# Patient Record
Sex: Male | Born: 1975 | Race: Black or African American | Hispanic: No | Smoking: Current some day smoker
Health system: Southern US, Community
[De-identification: ages and names within clinical notes are randomized; demographics above are authoritative.]

## PROBLEM LIST (undated history)

## (undated) DIAGNOSIS — W3400XA Accidental discharge from unspecified firearms or gun, initial encounter: Secondary | ICD-10-CM

## (undated) DIAGNOSIS — I10 Essential (primary) hypertension: Secondary | ICD-10-CM

## (undated) HISTORY — DX: Accidental discharge from unspecified firearms or gun, initial encounter: W34.00XA

## (undated) HISTORY — PX: OTHER SURGICAL HISTORY: SHX169

## (undated) HISTORY — DX: Essential (primary) hypertension: I10

## (undated) HISTORY — PX: TONSILLECTOMY: SUR1361

---

## 2004-07-03 ENCOUNTER — Emergency Department: Payer: Self-pay | Admitting: Emergency Medicine

## 2006-05-02 ENCOUNTER — Emergency Department: Payer: Self-pay | Admitting: Emergency Medicine

## 2006-05-19 ENCOUNTER — Emergency Department: Payer: Self-pay | Admitting: Emergency Medicine

## 2008-03-21 ENCOUNTER — Emergency Department: Payer: Self-pay | Admitting: Emergency Medicine

## 2008-07-23 ENCOUNTER — Emergency Department: Payer: Self-pay | Admitting: Emergency Medicine

## 2010-09-09 ENCOUNTER — Inpatient Hospital Stay: Payer: Self-pay | Admitting: Internal Medicine

## 2010-09-10 DIAGNOSIS — I10 Essential (primary) hypertension: Secondary | ICD-10-CM

## 2010-09-11 ENCOUNTER — Telehealth: Payer: Self-pay | Admitting: *Deleted

## 2010-09-11 NOTE — Telephone Encounter (Signed)
Attempted to call pt to schedule treadmill myoview post discharge from [facility] per Dr. Shirlee Latch for elev c.e., jaw pain, htn. No answer x 1, unable to leave voicemail. Pt will need myoview early next week.

## 2010-09-14 ENCOUNTER — Other Ambulatory Visit: Payer: Self-pay | Admitting: Cardiology

## 2010-09-14 DIAGNOSIS — R6884 Jaw pain: Secondary | ICD-10-CM

## 2010-09-14 DIAGNOSIS — I10 Essential (primary) hypertension: Secondary | ICD-10-CM

## 2010-09-14 DIAGNOSIS — R748 Abnormal levels of other serum enzymes: Secondary | ICD-10-CM

## 2010-09-14 NOTE — Telephone Encounter (Signed)
Attempted to contact pt, LMOM TCB.  

## 2010-09-14 NOTE — Telephone Encounter (Signed)
Spoke to pt, scheduled myoview at [facility] for tomorrow 09/15/10, instructions given for test, pt verbalized understanding and has no questions. He will follow up in clinic with Dr. Mariah Milling, pt requested his appt be r/s for later date, he will come 09/29/10. Notified pt we will go over results at visit unless abnormal, will call.

## 2010-09-15 ENCOUNTER — Ambulatory Visit: Payer: Self-pay | Admitting: Cardiology

## 2010-09-15 DIAGNOSIS — I10 Essential (primary) hypertension: Secondary | ICD-10-CM

## 2010-09-16 ENCOUNTER — Telehealth: Payer: Self-pay | Admitting: *Deleted

## 2010-09-16 NOTE — Telephone Encounter (Signed)
Called and notified pt that per Dr. Mariah Milling his stress test at [facility] was normal. Pt will f/u 09/29/10 in office.

## 2010-09-22 ENCOUNTER — Encounter: Payer: Self-pay | Admitting: Cardiovascular Disease

## 2010-09-29 ENCOUNTER — Encounter: Payer: Self-pay | Admitting: Cardiovascular Disease

## 2010-09-29 ENCOUNTER — Ambulatory Visit (INDEPENDENT_AMBULATORY_CARE_PROVIDER_SITE_OTHER): Payer: Self-pay | Admitting: Cardiovascular Disease

## 2010-09-29 DIAGNOSIS — I10 Essential (primary) hypertension: Secondary | ICD-10-CM

## 2010-09-29 DIAGNOSIS — R079 Chest pain, unspecified: Secondary | ICD-10-CM

## 2010-09-29 MED ORDER — VERAPAMIL HCL ER 120 MG PO TBCR: 120.0000 mg | EXTENDED_RELEASE_TABLET | Freq: Two times a day (BID) | ORAL | Status: DC

## 2010-09-29 NOTE — Assessment & Plan Note (Signed)
Blood pressure continues to be elevated. We will stop his carvedilol. Will start verapamil 120 mg b.i.d., continue his lisinopril 20 mg daily. We've asked him to monitor his blood pressures an outpatient and we will see him at close followup.

## 2010-09-29 NOTE — Progress Notes (Signed)
   Patient ID: Jeffery [name], male    DOB: May 17, 1975, 35 y.o.   MRN: [id number]  HPI Comments: 35 year old African American gentleman who presented to the hospital 2 weeks ago with symptoms of earache radiating to his jaw, severe hypertension with systolic pressure 180/100, elevated cardiac enzymes. Echocardiogram showed ejection fraction was essentially normal. No significant LVH. Overall it was a normal study. He was started on Coreg and lisinopril he now returns for followup. He does not have a blood pressure cuff at home. He has been feeling well on the current medications.  He denies any significant symptoms. No further earache or jaw pain. No chest pain no shortness of breath.  EKG shows normal sinus rhythm with rate 61 beats per minute with no significant ST-T wave changes  Outpatient Encounter Prescriptions as of 09/29/2010  Medication Sig Dispense Refill  . aspirin 81 MG tablet Take 81 mg by mouth daily.        . carvedilol (COREG) 12.5 MG tablet Take 12.5 mg by mouth 2 (two) times daily with a meal.        . lisinopril (PRINIVIL,ZESTRIL) 20 MG tablet Take 20 mg by mouth daily.            Review of Systems  Constitutional: Negative.   HENT: Negative.   Eyes: Negative.   Respiratory: Negative.   Cardiovascular: Negative.   Gastrointestinal: Negative.   Musculoskeletal: Negative.   Skin: Negative.   Neurological: Negative.   Hematological: Negative.   Psychiatric/Behavioral: Negative.   All other systems reviewed and are negative.   BP 158/100  Pulse 64  Ht 5\' 11"  (1.803 m)  Wt 185 lb (83.915 kg)  BMI 25.80 kg/m2   Physical Exam  Nursing note and vitals reviewed. Constitutional: He is oriented to person, place, and time. He appears well-developed and well-nourished.  HENT:  Head: Normocephalic.  Nose: Nose normal.  Mouth/Throat: Oropharynx is clear and moist.  Eyes: Conjunctivae are normal. Pupils are equal, round, and reactive to light.  Neck: Normal range of  motion. Neck supple. No JVD present.  Cardiovascular: Normal rate, regular rhythm, S1 normal, S2 normal, normal heart sounds and intact distal pulses.  Exam reveals no gallop and no friction rub.   No murmur heard. Pulmonary/Chest: Effort normal and breath sounds normal. No respiratory distress. He has no wheezes. He has no rales. He exhibits no tenderness.  Abdominal: Soft. Bowel sounds are normal. He exhibits no distension. There is no tenderness.  Musculoskeletal: Normal range of motion. He exhibits no edema and no tenderness.  Lymphadenopathy:    He has no cervical adenopathy.  Neurological: He is alert and oriented to person, place, and time. Coordination normal.  Skin: Skin is warm and dry. No rash noted. No erythema.  Psychiatric: He has a normal mood and affect. His behavior is normal. Judgment and thought content normal.           Assessment and Plan

## 2010-09-29 NOTE — Patient Instructions (Signed)
You are doing well. Please hold the coreg. Start verapamil 120 mg twice a day (AM and PM) Check your blood pressure in the next few weeks Please call us if you have new issues that need to be addressed before your next appt.  We will call you for a follow up Appt. In 6 months

## 2010-10-02 ENCOUNTER — Other Ambulatory Visit: Payer: Self-pay | Admitting: *Deleted

## 2010-10-02 MED ORDER — DILTIAZEM HCL ER 90 MG PO CP12: 90.0000 mg | ORAL_CAPSULE | Freq: Two times a day (BID) | ORAL | Status: DC

## 2010-10-02 NOTE — Telephone Encounter (Signed)
Notified pt that Dr. Mariah Milling will change his rx from Verapamil to Diltiazem 90 bid. This should be cheaper medication, it is on $4 [employer]. Advised he try this and let us know how it is working for you.

## 2010-10-05 ENCOUNTER — Other Ambulatory Visit: Payer: Self-pay | Admitting: *Deleted

## 2010-10-05 MED ORDER — DILTIAZEM HCL 90 MG PO TABS: 90.0000 mg | ORAL_TABLET | Freq: Two times a day (BID) | ORAL | Status: DC

## 2010-10-15 ENCOUNTER — Other Ambulatory Visit: Payer: Self-pay | Admitting: Cardiovascular Disease

## 2010-10-15 NOTE — Telephone Encounter (Signed)
Patient calling with elevated blood pressure.  The blood pressure today is reading at 9:00 am today 169/99, 186/106 heart rate 74, and one hour later 156/94.  He does c/o some headache but has eased off now.  He has been out of the lisinopril since Monday, October 12, 2010.  I sent in a refill today for the lisinopril.  The patient will take the medication as soon as he picks up at the pharmacy and will monitor blood pressure after he takes it and was told to call the office with readings and was instructed if blood pressure still elevated with symptoms to call office or have EMS come to evaluate the blood pressure.

## 2010-10-18 ENCOUNTER — Emergency Department: Payer: Self-pay | Admitting: Internal Medicine

## 2013-04-03 ENCOUNTER — Encounter: Payer: Self-pay | Admitting: *Deleted

## 2015-06-26 ENCOUNTER — Encounter: Payer: Self-pay | Admitting: Podiatry

## 2015-06-26 ENCOUNTER — Ambulatory Visit: Payer: Self-pay

## 2015-06-26 NOTE — Progress Notes (Signed)
No show

## 2017-06-13 ENCOUNTER — Encounter: Payer: Self-pay | Admitting: *Deleted

## 2017-06-13 ENCOUNTER — Ambulatory Visit
Admission: EM | Admit: 2017-06-13 | Discharge: 2017-06-13 | Disposition: A | Discharge status: Home / Self Care | Payer: BLUE CROSS/BLUE SHIELD | Attending: Family Medicine | Admitting: Family Medicine

## 2017-06-13 DIAGNOSIS — J069 Acute upper respiratory infection, unspecified: Secondary | ICD-10-CM | POA: Diagnosis not present

## 2017-06-13 DIAGNOSIS — B9789 Other viral agents as the cause of diseases classified elsewhere: Secondary | ICD-10-CM | POA: Diagnosis not present

## 2017-06-13 LAB — RAPID STREP SCREEN (MED CTR MEBANE ONLY): STREPTOCOCCUS, GROUP A SCREEN (DIRECT): NEGATIVE

## 2017-06-13 MED ORDER — FLUTICASONE PROPIONATE 50 MCG/ACT NA SUSP: 2.0000 | Freq: Every day | NASAL | 0 refills | Status: DC

## 2017-06-13 MED ORDER — CETIRIZINE HCL 10 MG PO TABS: 10.0000 mg | ORAL_TABLET | Freq: Every day | ORAL | 0 refills | Status: DC

## 2017-06-13 NOTE — ED Triage Notes (Signed)
Runny nose, head congestion, sore throat, fever, onset last night. Daughter dx with URI 1 week ago.  Also, injury to right middle finger 1 month ago. Nail is discolored.
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
## 2017-06-16 LAB — CULTURE, GROUP A STREP (THRC)

## 2017-07-05 ENCOUNTER — Other Ambulatory Visit: Payer: Self-pay | Admitting: Family Medicine

## 2018-04-08 ENCOUNTER — Encounter: Payer: Self-pay | Admitting: Emergency Medicine

## 2018-04-08 ENCOUNTER — Other Ambulatory Visit: Payer: Self-pay

## 2018-04-08 ENCOUNTER — Ambulatory Visit
Admission: EM | Admit: 2018-04-08 | Discharge: 2018-04-08 | Disposition: A | Discharge status: Home / Self Care | Payer: BLUE CROSS/BLUE SHIELD | Attending: Family Medicine | Admitting: Family Medicine

## 2018-04-08 DIAGNOSIS — B353 Tinea pedis: Secondary | ICD-10-CM | POA: Diagnosis not present

## 2018-04-08 DIAGNOSIS — L0889 Other specified local infections of the skin and subcutaneous tissue: Secondary | ICD-10-CM

## 2018-04-08 MED ORDER — FLUCONAZOLE 150 MG PO TABS: ORAL_TABLET | ORAL | 0 refills | Status: DC

## 2018-04-08 MED ORDER — SULFAMETHOXAZOLE-TRIMETHOPRIM 800-160 MG PO TABS: 1.0000 | ORAL_TABLET | Freq: Two times a day (BID) | ORAL | 0 refills | Status: DC

## 2018-04-08 NOTE — ED Provider Notes (Signed)
[facility]    CSN: [id number] Arrival date & time: 04/08/18  1540     History   Chief Complaint Chief Complaint  Patient presents with  . Blister    APPOINTMENT    HPI Janell Cocchi is a 43 y.o. male.   43 yo male with a c/o "foot problems" for one year. States he's had athlete's foot that doesn't go away and a recurring blister on one of his toes. Denies any fevers, chills.   The history is provided by the patient.    Past Medical History:  Diagnosis Date  . Hypertension     There are no active problems to display for this patient.   Past Surgical History:  Procedure Laterality Date  . TONSILLECTOMY         Home Medications    Prior to Admission medications   Medication Sig Start Date End Date Taking? Authorizing Provider  lisinopril-hydrochlorothiazide (PRINZIDE,ZESTORETIC) 20-25 MG tablet Take 1 tablet by mouth daily.   Yes [provider]  fluconazole (DIFLUCAN) 150 MG tablet Take one tablet today, then repeat in 1 week 04/08/18   Payton Mccallum, MD  sulfamethoxazole-trimethoprim (BACTRIM DS,SEPTRA DS) 800-160 MG tablet Take 1 tablet by mouth 2 (two) times daily. 04/08/18   Payton Mccallum, MD    Family History Family History  Problem Relation Age of Onset  . Cancer Mother     Social History Social History   Tobacco Use  . Smoking status: Former Smoker    Types: Cigars  . Smokeless tobacco: Never Used  Substance Use Topics  . Alcohol use: Yes  . Drug use: Never     Allergies   Patient has no known allergies.   Review of Systems Review of Systems   Physical Exam Triage Vital Signs ED Triage Vitals  Enc Vitals Group     BP 04/08/18 1007 (!) 144/82     Pulse Rate 04/08/18 1007 99     Resp 04/08/18 1007 16     Temp 04/08/18 1007 98.1 F (36.7 C)     Temp Source 04/08/18 1007 Oral     SpO2 04/08/18 1007 100 %     Weight 04/08/18 1003 225 lb (102.1 kg)     Height 04/08/18 1003 5\' 11"  (1.803 m)     Head  Circumference --      Peak Flow --      Pain Score 04/08/18 1003 0     Pain Loc --      Pain Edu? --      Excl. in GC? --    No data found.  Updated Vital Signs BP (!) 144/82 (BP Location: Left Arm)   Pulse 99   Temp 98.1 F (36.7 C) (Oral)   Resp 16   Ht 5\' 11"  (1.803 m)   Wt 102.1 kg   SpO2 100%   BMI 31.38 kg/m   Visual Acuity Right Eye Distance:   Left Eye Distance:   Bilateral Distance:    Right Eye Near:   Left Eye Near:    Bilateral Near:     Physical Exam Vitals signs and nursing note reviewed.  Constitutional:      Appearance: He is not toxic-appearing.  Skin:    Comments: Scaly, cracked skin between toes on left foot with 3rd toe dorsal superficial blister with mild purulent drainage  Neurological:     Mental Status: He is alert.      UC Treatments / Results  Labs (all labs ordered  are listed, but only abnormal results are displayed) Labs Reviewed - No data to display  EKG None  Radiology No results found.  Procedures Procedures (including critical care time)  Medications Ordered in UC Medications - No data to display  Initial Impression / Assessment and Plan / UC Course  I have reviewed the triage vital signs and the nursing notes.  Pertinent labs & imaging results that were available during my care of the patient were reviewed by me and considered in my medical decision making (see chart for details).      Final Clinical Impressions(s) / UC Diagnoses   Final diagnoses:  Tinea pedis of left foot  Secondary infection of skin      Discharge Instructions     Over the counter Tinactin or Lamisil antifungal powder apply two-three times per day between toes and in socks and shoes    ED Prescriptions    Medication Sig Dispense Auth. Provider   fluconazole (DIFLUCAN) 150 MG tablet Take one tablet today, then repeat in 1 week 2 tablet Emilio Baylock, MD   sulfamethoxazole-trimethoprim (BACTRIM DS,SEPTRA DS) 800-160 MG tablet Take  1 tablet by mouth 2 (two) times daily. 20 tablet Payton Mccallum, MD     1. diagnosis reviewed with patient 2. rx as per orders above; reviewed possible side effects, interactions, risks and benefits  3. Recommend supportive treatment as above  4. Follow-up prn if symptoms worsen or don't improve   Controlled Substance Prescriptions [place] Controlled Substance Registry consulted? Not Applicable   Payton Mccallum, MD 04/08/18 1056

## 2018-04-08 NOTE — ED Triage Notes (Signed)
Patient c/o blister on his left 4th toe that has been there for a year.

## 2018-04-08 NOTE — Discharge Instructions (Addendum)
Over the counter Tinactin or Lamisil antifungal powder apply two-three times per day between toes and in socks and shoes

## 2018-04-10 ENCOUNTER — Encounter: Payer: Self-pay | Admitting: *Deleted

## 2018-05-18 ENCOUNTER — Ambulatory Visit (INDEPENDENT_AMBULATORY_CARE_PROVIDER_SITE_OTHER): Payer: BLUE CROSS/BLUE SHIELD

## 2018-05-18 ENCOUNTER — Other Ambulatory Visit: Payer: Self-pay

## 2018-05-18 ENCOUNTER — Encounter: Payer: Self-pay | Admitting: Emergency Medicine

## 2018-05-18 ENCOUNTER — Ambulatory Visit
Admission: EM | Admit: 2018-05-18 | Discharge: 2018-05-18 | Disposition: A | Discharge status: Home / Self Care | Payer: BLUE CROSS/BLUE SHIELD | Attending: Family Medicine | Admitting: Family Medicine

## 2018-05-18 DIAGNOSIS — L84 Corns and callosities: Secondary | ICD-10-CM | POA: Diagnosis not present

## 2018-05-18 DIAGNOSIS — B353 Tinea pedis: Secondary | ICD-10-CM

## 2018-05-18 DIAGNOSIS — I10 Essential (primary) hypertension: Secondary | ICD-10-CM

## 2018-05-18 MED ORDER — KETOCONAZOLE 2 % EX CREA: 1.0000 "application " | TOPICAL_CREAM | Freq: Every day | CUTANEOUS | 0 refills | Status: DC
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
## 2018-05-18 NOTE — ED Triage Notes (Signed)
Patient c/o blister on his toe on his right foot that started 1.5 months ago.

## 2019-07-18 ENCOUNTER — Ambulatory Visit: Payer: Self-pay | Attending: Internal Medicine

## 2019-07-18 DIAGNOSIS — Z23 Encounter for immunization: Secondary | ICD-10-CM

## 2019-07-18 NOTE — Progress Notes (Signed)
   Covid-19 Vaccination Clinic  Name:  Jeffery [name]    MRN: [id number] DOB: 03/22/1976  07/18/2019  [name] was observed post Covid-19 immunization for 15 minutes without incident. He was provided with Vaccine Information Sheet and instruction to access the V-Safe system.   [name] was instructed to call 911 with any severe reactions post vaccine: Marland Kitchen Difficulty breathing  . Swelling of face and throat  . A fast heartbeat  . A bad rash all over body  . Dizziness and weakness   Immunizations Administered    Name Date Dose VIS Date Route   Pfizer COVID-19 Vaccine 07/18/2019  9:47 AM 0.3 mL 05/16/2018 Intramuscular   Manufacturer: [employer], [employer]   Lot: [id number]   NDC: [address]-6

## 2019-08-14 ENCOUNTER — Ambulatory Visit: Payer: Self-pay

## 2019-08-14 ENCOUNTER — Ambulatory Visit: Payer: Self-pay | Attending: Internal Medicine

## 2019-08-14 DIAGNOSIS — Z23 Encounter for immunization: Secondary | ICD-10-CM

## 2019-08-14 NOTE — Progress Notes (Addendum)
   Covid-19 Vaccination Clinic  Name:  OMAURI BOEVE    MRN: [id number] DOB: September 07, 1975  08/14/2019  Mr. Humiston was observed post Covid-19 immunization for 15 minutes without incident. He was provided with Vaccine Information Sheet and instruction to access the V-Safe system.   Mr. Kydd was instructed to call 911 with any severe reactions post vaccine: Marland Kitchen Difficulty breathing  . Swelling of face and throat  . A fast heartbeat  . A bad rash all over body  . Dizziness and weakness   Immunizations Administered    Name Date Dose VIS Date Route   Pfizer COVID-19 Vaccine 08/14/2019  9:51 AM 0.3 mL 05/16/2018 Intramuscular   Manufacturer: [employer], [employer]   Lot: [id number]   NDC: [address]-1

## 2019-09-09 ENCOUNTER — Other Ambulatory Visit: Payer: Self-pay

## 2019-09-09 ENCOUNTER — Observation Stay
Admission: EM | Admit: 2019-09-09 | Discharge: 2019-09-10 | Disposition: A | Discharge status: Home / Self Care | Payer: Self-pay | Attending: Internal Medicine | Admitting: Internal Medicine

## 2019-09-09 ENCOUNTER — Emergency Department: Payer: Self-pay

## 2019-09-09 DIAGNOSIS — Z79899 Other long term (current) drug therapy: Secondary | ICD-10-CM | POA: Insufficient documentation

## 2019-09-09 DIAGNOSIS — Z20822 Contact with and (suspected) exposure to covid-19: Secondary | ICD-10-CM | POA: Insufficient documentation

## 2019-09-09 DIAGNOSIS — I10 Essential (primary) hypertension: Secondary | ICD-10-CM | POA: Insufficient documentation

## 2019-09-09 DIAGNOSIS — I200 Unstable angina: Principal | ICD-10-CM | POA: Insufficient documentation

## 2019-09-09 DIAGNOSIS — Z87891 Personal history of nicotine dependence: Secondary | ICD-10-CM | POA: Insufficient documentation

## 2019-09-09 DIAGNOSIS — R079 Chest pain, unspecified: Secondary | ICD-10-CM | POA: Diagnosis present

## 2019-09-09 DIAGNOSIS — Z7982 Long term (current) use of aspirin: Secondary | ICD-10-CM | POA: Insufficient documentation

## 2019-09-09 DIAGNOSIS — Z8249 Family history of ischemic heart disease and other diseases of the circulatory system: Secondary | ICD-10-CM | POA: Insufficient documentation

## 2019-09-09 DIAGNOSIS — R011 Cardiac murmur, unspecified: Secondary | ICD-10-CM | POA: Insufficient documentation

## 2019-09-09 LAB — COMPREHENSIVE METABOLIC PANEL
ALT: 27 U/L (ref 0–44)
AST: 21 U/L (ref 15–41)
Albumin: 4.5 g/dL (ref 3.5–5.0)
Alkaline Phosphatase: 73 U/L (ref 38–126)
Anion gap: 13 (ref 5–15)
BUN: 22 mg/dL — ABNORMAL HIGH (ref 6–20)
CO2: 24 mmol/L (ref 22–32)
Calcium: 10.1 mg/dL (ref 8.9–10.3)
Chloride: 101 mmol/L (ref 98–111)
Creatinine, Ser: 1.24 mg/dL (ref 0.61–1.24)
GFR calc Af Amer: >60 mL/min (ref >60)
GFR calc non Af Amer: >60 mL/min (ref >60)
Glucose, Bld: 100 mg/dL — ABNORMAL HIGH (ref 70–99)
Potassium: 3.6 mmol/L (ref 3.5–5.1)
Sodium: 138 mmol/L (ref 135–145)
Total Bilirubin: 0.9 mg/dL (ref 0.3–1.2)
Total Protein: 7.8 g/dL (ref 6.5–8.1)

## 2019-09-09 LAB — CBC
HCT: 43.5 % (ref 39.0–52.0)
Hemoglobin: 15.6 g/dL (ref 13.0–17.0)
MCH: 31.8 pg (ref 26.0–34.0)
MCHC: 35.9 g/dL (ref 30.0–36.0)
MCV: 88.8 fL (ref 80.0–100.0)
Platelets: 296 10*3/uL (ref 150–400)
RBC: 4.9 MIL/uL (ref 4.22–5.81)
RDW: 12 % (ref 11.5–15.5)
WBC: 16.3 10*3/uL — ABNORMAL HIGH (ref 4.0–10.5)
nRBC: 0 % (ref 0.0–0.2)

## 2019-09-09 LAB — TROPONIN I (HIGH SENSITIVITY): Troponin I (High Sensitivity): 4 ng/L (ref <18)

## 2019-09-09 NOTE — ED Notes (Signed)
Pt c/o pressure in lower back, denies CP, N/V/D, SOB, dizziness. Pt AOX4, NAD noted. Skin warm, dry. Pt reports he was smoking and drinking a  "couple of shots" at friends house for father's day today.

## 2019-09-09 NOTE — ED Notes (Signed)
Second ekg given to dr. Penne Lash who states is going to call cardiology now, will not activate code stemi immediately per isaacs, but will need treatment room. Charge rn notified.

## 2019-09-09 NOTE — ED Triage Notes (Signed)
Pt states mid chest pain and upper back pain that radiates at times to shoulders off and on for last couple of weeks. Pt states intermittent "not feeling like myself". Pt denies nausea, diaphoresis, but states does have some intermittent shob with pain.

## 2019-09-10 ENCOUNTER — Encounter: Payer: Self-pay | Admitting: Family Medicine

## 2019-09-10 ENCOUNTER — Other Ambulatory Visit: Payer: Self-pay

## 2019-09-10 ENCOUNTER — Observation Stay (HOSPITAL_BASED_OUTPATIENT_CLINIC_OR_DEPARTMENT_OTHER): Payer: Self-pay

## 2019-09-10 DIAGNOSIS — E785 Hyperlipidemia, unspecified: Secondary | ICD-10-CM | POA: Insufficient documentation

## 2019-09-10 DIAGNOSIS — I10 Essential (primary) hypertension: Secondary | ICD-10-CM

## 2019-09-10 DIAGNOSIS — F1210 Cannabis abuse, uncomplicated: Secondary | ICD-10-CM

## 2019-09-10 DIAGNOSIS — R079 Chest pain, unspecified: Secondary | ICD-10-CM | POA: Diagnosis present

## 2019-09-10 DIAGNOSIS — E669 Obesity, unspecified: Secondary | ICD-10-CM | POA: Insufficient documentation

## 2019-09-10 LAB — NM MYOCAR MULTI W/SPECT W/WALL MOTION / EF
LV dias vol: 132 mL (ref 62–150)
LV sys vol: 60 mL
Peak HR: 118 {beats}/min
Percent HR: 66 %
Rest HR: 82 {beats}/min
SDS: 2
SRS: 5
SSS: 4
TID: 1.04

## 2019-09-10 LAB — URINE DRUG SCREEN, QUALITATIVE (ARMC ONLY)
Amphetamines, Ur Screen: NOT DETECTED
Barbiturates, Ur Screen: NOT DETECTED
Benzodiazepine, Ur Scrn: NOT DETECTED
Cannabinoid 50 Ng, Ur ~~LOC~~: POSITIVE — AB
Cocaine Metabolite,Ur ~~LOC~~: NOT DETECTED
MDMA (Ecstasy)Ur Screen: NOT DETECTED
Methadone Scn, Ur: NOT DETECTED
Opiate, Ur Screen: NOT DETECTED
Phencyclidine (PCP) Ur S: NOT DETECTED
Tricyclic, Ur Screen: NOT DETECTED

## 2019-09-10 LAB — URINALYSIS, COMPLETE (UACMP) WITH MICROSCOPIC
Bacteria, UA: NONE SEEN
Bilirubin Urine: NEGATIVE
Glucose, UA: NEGATIVE mg/dL
Hgb urine dipstick: NEGATIVE
Ketones, ur: NEGATIVE mg/dL
Leukocytes,Ua: NEGATIVE
Nitrite: NEGATIVE
Protein, ur: NEGATIVE mg/dL
Specific Gravity, Urine: 1.025 (ref 1.005–1.030)
Squamous Epithelial / HPF: NONE SEEN (ref 0–5)
pH: 5 (ref 5.0–8.0)

## 2019-09-10 LAB — TROPONIN I (HIGH SENSITIVITY): Troponin I (High Sensitivity): 4 ng/L (ref <18)

## 2019-09-10 LAB — LIPID PANEL
Cholesterol: 228 mg/dL — ABNORMAL HIGH (ref 0–200)
HDL: 48 mg/dL (ref >40)
LDL Cholesterol: 149 mg/dL — ABNORMAL HIGH (ref 0–99)
Total CHOL/HDL Ratio: 4.8 RATIO
Triglycerides: 155 mg/dL — ABNORMAL HIGH (ref <150)
VLDL: 31 mg/dL (ref 0–40)

## 2019-09-10 LAB — HIV ANTIBODY (ROUTINE TESTING W REFLEX): HIV Screen 4th Generation wRfx: NONREACTIVE

## 2019-09-10 LAB — SARS CORONAVIRUS 2 BY RT PCR (HOSPITAL ORDER, PERFORMED IN ~~LOC~~ HOSPITAL LAB): SARS Coronavirus 2: NEGATIVE

## 2019-09-10 MED ORDER — HYDROCHLOROTHIAZIDE 25 MG PO TABS: 25.0000 mg | ORAL_TABLET | Freq: Every day | ORAL | Status: DC

## 2019-09-10 MED ORDER — ATORVASTATIN CALCIUM 20 MG PO TABS: 40.0000 mg | ORAL_TABLET | Freq: Every day | ORAL | Status: DC

## 2019-09-10 MED ORDER — ASPIRIN 81 MG PO CHEW
324.0000 mg | CHEWABLE_TABLET | Freq: Once | ORAL | Status: AC
  Administered 2019-09-10: 324 mg via ORAL
  Filled 2019-09-10: qty 4

## 2019-09-10 MED ORDER — ASPIRIN EC 81 MG PO TBEC
81.0000 mg | DELAYED_RELEASE_TABLET | Freq: Every day | ORAL | Status: DC
  Administered 2019-09-10: 81 mg via ORAL
  Filled 2019-09-10: qty 1

## 2019-09-10 MED ORDER — NITROGLYCERIN 0.4 MG SL SUBL: 0.4000 mg | SUBLINGUAL_TABLET | SUBLINGUAL | Status: DC | PRN

## 2019-09-10 MED ORDER — MORPHINE SULFATE (PF) 2 MG/ML IV SOLN: 2.0000 mg | INTRAVENOUS | Status: DC | PRN

## 2019-09-10 MED ORDER — LISINOPRIL 10 MG PO TABS
20.0000 mg | ORAL_TABLET | Freq: Every day | ORAL | Status: DC
  Filled 2019-09-10: qty 2

## 2019-09-10 MED ORDER — ALUM & MAG HYDROXIDE-SIMETH 200-200-20 MG/5ML PO SUSP: 30.0000 mL | Freq: Once | ORAL | Status: DC

## 2019-09-10 MED ORDER — ENOXAPARIN SODIUM 40 MG/0.4ML ~~LOC~~ SOLN
40.0000 mg | SUBCUTANEOUS | Status: DC
  Administered 2019-09-10: 40 mg via SUBCUTANEOUS
  Filled 2019-09-10: qty 0.4

## 2019-09-10 MED ORDER — LISINOPRIL-HYDROCHLOROTHIAZIDE 20-25 MG PO TABS: 1.0000 | ORAL_TABLET | Freq: Every day | ORAL | Status: DC

## 2019-09-10 MED ORDER — REGADENOSON 0.4 MG/5ML IV SOLN
0.4000 mg | Freq: Once | INTRAVENOUS | Status: AC
  Administered 2019-09-10: 0.4 mg via INTRAVENOUS
  Filled 2019-09-10: qty 5

## 2019-09-10 MED ORDER — ONDANSETRON HCL 4 MG/2ML IJ SOLN: 4.0000 mg | Freq: Four times a day (QID) | INTRAMUSCULAR | Status: DC | PRN

## 2019-09-10 MED ORDER — DILTIAZEM HCL 60 MG PO TABS: 90.0000 mg | ORAL_TABLET | Freq: Two times a day (BID) | ORAL | Status: DC

## 2019-09-10 MED ORDER — ATORVASTATIN CALCIUM 40 MG PO TABS: 40.0000 mg | ORAL_TABLET | Freq: Every day | ORAL | 0 refills | Status: DC

## 2019-09-10 MED ORDER — TECHNETIUM TC 99M TETROFOSMIN IV KIT
30.0000 | PACK | Freq: Once | INTRAVENOUS | Status: AC | PRN
  Administered 2019-09-10: 31.893 via INTRAVENOUS
  Filled 2019-09-10: qty 30

## 2019-09-10 MED ORDER — CARVEDILOL 6.25 MG PO TABS: 12.5000 mg | ORAL_TABLET | Freq: Two times a day (BID) | ORAL | Status: DC

## 2019-09-10 MED ORDER — LISINOPRIL 10 MG PO TABS: 20.0000 mg | ORAL_TABLET | Freq: Every day | ORAL | Status: DC

## 2019-09-10 MED ORDER — POTASSIUM CHLORIDE 20 MEQ PO PACK
40.0000 meq | PACK | Freq: Once | ORAL | Status: AC
  Administered 2019-09-10: 40 meq via ORAL
  Filled 2019-09-10: qty 2

## 2019-09-10 MED ORDER — LIDOCAINE VISCOUS HCL 2 % MT SOLN: 15.0000 mL | Freq: Once | OROMUCOSAL | Status: DC

## 2019-09-10 MED ORDER — SODIUM CHLORIDE 0.9 % IV SOLN: INTRAVENOUS | Status: DC

## 2019-09-10 MED ORDER — TECHNETIUM TC 99M TETROFOSMIN IV KIT
10.0000 | PACK | Freq: Once | INTRAVENOUS | Status: AC | PRN
  Administered 2019-09-10: 10.43 via INTRAVENOUS
  Filled 2019-09-10: qty 10

## 2019-09-10 MED ORDER — ALPRAZOLAM 0.25 MG PO TABS: 0.2500 mg | ORAL_TABLET | Freq: Two times a day (BID) | ORAL | Status: DC | PRN

## 2019-09-10 MED ORDER — ACETAMINOPHEN 325 MG PO TABS: 650.0000 mg | ORAL_TABLET | ORAL | Status: DC | PRN

## 2019-09-10 MED ORDER — ZOLPIDEM TARTRATE 5 MG PO TABS: 5.0000 mg | ORAL_TABLET | Freq: Every evening | ORAL | Status: DC | PRN
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
## 2019-09-10 NOTE — ED Notes (Signed)
Pt to Nuc Med at this time for stress test

## 2019-09-10 NOTE — Discharge Instructions (Signed)
Patient advised to get primary care physician in the area. Patient advised diet and exercise.

## 2019-09-10 NOTE — Discharge Summary (Signed)
[facility] - [place] at [facility]   PATIENT NAME: Jeffery [name]    MR#:  [id number]  DATE OF BIRTH:  [birth year]  DATE OF ADMISSION:  09/09/2019 ADMITTING PHYSICIAN: Hannah Beat, MD  DATE OF DISCHARGE: 09/10/2019  PRIMARY CARE PHYSICIAN: Patient, No Pcp Per    ADMISSION DIAGNOSIS:  Chest pain [R07.9]  DISCHARGE DIAGNOSIS:  chest pain with atypical features rule out MI Hypertension Obesity hyperlipidemia  SECONDARY DIAGNOSIS:   Past Medical History:  Diagnosis Date  . Hypertension     HOSPITAL COURSE:  Jeffery [name]  is a 44 y.o. African-American male with a known history of hypertension and ongoing tobacco abuse, presented to the emergency room with acute onset of midsternal chest pain graded 5/10 in severity and felt as aching with no nausea or vomiting or diaphoresis, dyspnea or palpitations   1.  Chest pain, ruled out acute coronary syndrome. - follow up serial troponin I's-- remain flat -continue aspirin, PRN Nitro  -Cardiology consultation with Dr. Kirke Corin appreciated. Patient underwent myoview stress test. -Negative. -Patient hemodynamically stable. Denies any chest pain.  2.  Leukocytosis. -This could be related to stress demargination.  3.   hyperlipidemia started on statins. Diet and exercise discussed.  4.  Hypertension. -continue Zestril.  5.  Ongoing marijuana abuse -I counseled him for cessation   6.  DVT prophylaxis. -Subcutaneous Lovenox   Overall hemodynamically stable. Patient will discharged to home. He is agreeable with the plan.   CONSULTS OBTAINED:  Treatment Team:  Iran Ouch, MD  DRUG ALLERGIES:   Allergies  Allergen Reactions  . Shellfish Allergy     DISCHARGE MEDICATIONS:   Allergies as of 09/10/2019      Reactions   Shellfish Allergy       Medication List    TAKE these medications   aspirin 81 MG tablet Take 81 mg by mouth daily.   atorvastatin 40 MG tablet Commonly known as:  LIPITOR Take 1 tablet (40 mg total) by mouth daily.   lisinopril-hydrochlorothiazide 20-25 MG tablet Commonly known as: ZESTORETIC Take 1 tablet by mouth daily.       If you experience worsening of your admission symptoms, develop shortness of breath, life threatening emergency, suicidal or homicidal thoughts you must seek medical attention immediately by calling 911 or calling your MD immediately  if symptoms less severe.  You Must read complete instructions/literature along with all the possible adverse reactions/side effects for all the Medicines you take and that have been prescribed to you. Take any new Medicines after you have completely understood and accept all the possible adverse reactions/side effects.   Please note  You were cared for by a hospitalist during your hospital stay. If you have any questions about your discharge medications or the care you received while you were in the hospital after you are discharged, you can call the unit and asked to speak with the hospitalist on call if the hospitalist that took care of you is not available. Once you are discharged, your primary care physician will handle any further medical issues. Please note that NO REFILLS for any discharge medications will be authorized once you are discharged, as it is imperative that you return to your primary care physician (or establish a relationship with a primary care physician if you do not have one) for your aftercare needs so that they can reassess your need for medications and monitor your lab values. Today   SUBJECTIVE   Denies chest pain. He is  wondering when he can go home.  VITAL SIGNS:  Blood pressure (!) 148/88, pulse 82, temperature 98.2 F (36.8 C), temperature source Oral, resp. rate 18, height 5\' 10"  (1.778 m), weight 95.3 kg, SpO2 98 %.  I/O:  No intake or output data in the 24 hours ending 09/10/19 1543  PHYSICAL EXAMINATION:  GENERAL:  44 y.o.-year-old patient lying in the bed  with no acute distress. Obese EYES: Pupils equal, round, reactive to light and accommodation. No scleral icterus.  HEENT: Head atraumatic, normocephalic. Oropharynx and nasopharynx clear.  NECK:  Supple, no jugular venous distention. No thyroid enlargement, no tenderness.  LUNGS: Normal breath sounds bilaterally, no wheezing, rales,rhonchi or crepitation. No use of accessory muscles of respiration.  CARDIOVASCULAR: S1, S2 normal. No murmurs, rubs, or gallops.  ABDOMEN: Soft, non-tender, non-distended. Bowel sounds present. No organomegaly or mass.  EXTREMITIES: No pedal edema, cyanosis, or clubbing.  NEUROLOGIC: Cranial nerves II through XII are intact. Muscle strength 5/5 in all extremities. Sensation intact. Gait not checked.  PSYCHIATRIC: The patient is alert and oriented x 3.  SKIN: No obvious rash, lesion, or ulcer.   DATA REVIEW:   CBC  Recent Labs  Lab 09/09/19 2235  WBC 16.3*  HGB 15.6  HCT 43.5  PLT 296    Chemistries  Recent Labs  Lab 09/09/19 2235  NA 138  K 3.6  CL 101  CO2 24  GLUCOSE 100*  BUN 22*  CREATININE 1.24  CALCIUM 10.1  AST 21  ALT 27  ALKPHOS 73  BILITOT 0.9    Microbiology Results   Recent Results (from the past 240 hour(s))  SARS Coronavirus 2 by RT PCR (hospital order, performed in [facility] hospital lab) Nasopharyngeal Nasopharyngeal Swab     Status: None   Collection Time: 09/10/19  1:03 AM   Specimen: Nasopharyngeal Swab  Result Value Ref Range Status   SARS Coronavirus 2 NEGATIVE NEGATIVE Final    Comment: (NOTE) SARS-CoV-2 target nucleic acids are NOT DETECTED.  The SARS-CoV-2 RNA is generally detectable in upper and lower respiratory specimens during the acute phase of infection. The lowest concentration of SARS-CoV-2 viral copies this assay can detect is 250 copies / mL. A negative result does not preclude SARS-CoV-2 infection and should not be used as the sole basis for treatment or other patient management decisions.  A  negative result may occur with improper specimen collection / handling, submission of specimen other than nasopharyngeal swab, presence of viral mutation(s) within the areas targeted by this assay, and inadequate number of viral copies (<250 copies / mL). A negative result must be combined with clinical observations, patient history, and epidemiological information.  Fact Sheet for Patients:   09/12/19  Fact Sheet for Healthcare Providers: BoilerBrush.com.cy  This test is not yet approved or  cleared by the https://pope.com/ FDA and has been authorized for detection and/or diagnosis of SARS-CoV-2 by FDA under an Emergency Use Authorization (EUA).  This EUA will remain in effect (meaning this test can be used) for the duration of the COVID-19 declaration under Section 564(b)(1) of the Act, 21 U.S.C. section 360bbb-3(b)(1), unless the authorization is terminated or revoked sooner.  Performed at [facility], [address], [place], [place]     RADIOLOGY:  DG Chest 2 View  Result Date: 09/09/2019 CLINICAL DATA:  Chest pain. EXAM: CHEST - 2 VIEW COMPARISON:  Radiograph 09/09/2010 FINDINGS: The cardiomediastinal contours are normal. Overlying monitoring leads in place. No focal airspace disease. Possible small  pleural effusions. No pulmonary edema no acute osseous abnormalities are seen. Scoliotic curvature of spine. IMPRESSION: Possible small pleural effusions. Electronically Signed   By: Keith Rake M.D.   On: 09/09/2019 22:46   NM Myocar Multi W/Spect W/Wall Motion / EF  Result Date: 09/10/2019  There was no ST segment deviation noted during stress.  No T wave inversion was noted during stress.  Defect 1: There is a small defect of mild severity present in the apex location. This is likely due to apical thinning artifact  The study is normal.  This is a low risk study.  The left ventricular ejection  fraction is normal (55-65%).  Suboptimal study due to GI uptake. Nonattenuation corrected images were better quality.      CODE STATUS:     Code Status Orders  (From admission, onward)         Start     Ordered   09/10/19 0059  Full code  Continuous        09/10/19 0101        Code Status History    This patient has a current code status but no historical code status.   Advance Care Planning Activity       TOTAL TIME TAKING CARE OF THIS PATIENT: *40* minutes.    Fritzi Mandes M.D  [facility]    CC: Primary care physician; Patient, No Pcp Per
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
## 2019-09-10 NOTE — ED Notes (Signed)
Admitting MD messaged about pt wanting to talk about medications/dx.

## 2020-06-25 IMAGING — DX DG CHEST 2V
1 series · 1 of 1 positions shown · non-contrast
Comparison: Radiograph 09/09/2010

CLINICAL DATA: Chest pain.

EXAM:
CHEST - 2 VIEW

[chest ap]
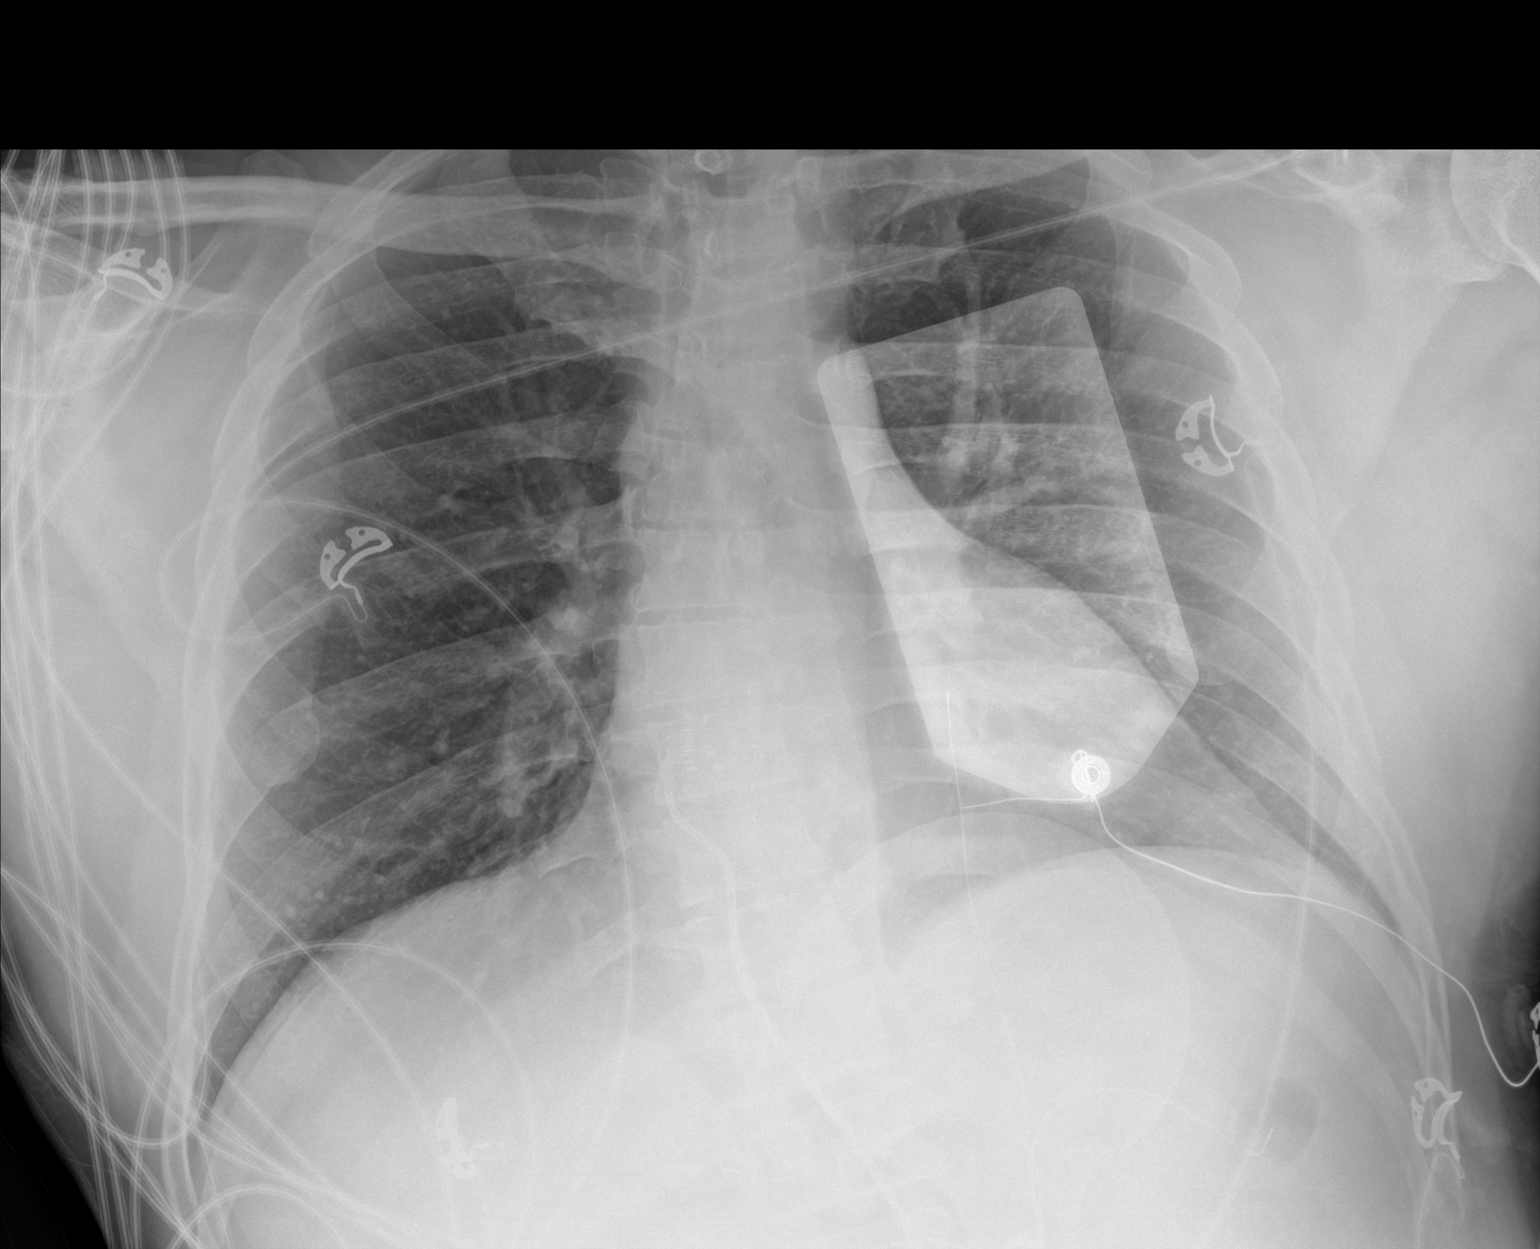

[1 of 1 positions shown; findings below may reference images not displayed]

FINDINGS: The cardiomediastinal contours are normal. Overlying monitoring
leads in place. No focal airspace disease. Possible small pleural
effusions. No pulmonary edema no acute osseous abnormalities are
seen. Scoliotic curvature of spine.
IMPRESSION: Possible small pleural effusions.

## 2020-12-30 ENCOUNTER — Encounter: Payer: Self-pay | Admitting: Emergency Medicine

## 2020-12-30 ENCOUNTER — Other Ambulatory Visit: Payer: Self-pay

## 2020-12-30 ENCOUNTER — Ambulatory Visit
Admission: EM | Admit: 2020-12-30 | Discharge: 2020-12-30 | Disposition: A | Discharge status: Home / Self Care | Payer: 59

## 2020-12-30 DIAGNOSIS — J069 Acute upper respiratory infection, unspecified: Secondary | ICD-10-CM

## 2020-12-30 DIAGNOSIS — J302 Other seasonal allergic rhinitis: Secondary | ICD-10-CM

## 2020-12-30 NOTE — Discharge Instructions (Addendum)
Discussed with patient most likely this is viral in nature and will need to run its course, no antibiotics indicated at this time.  Patient declines work note or testing .rest,push fluids. Take OTC meds for symptom management (tylenol, Coricidin HBP, zyrtec, or Claritin or allegra, flonase nasal spray). Return if new or worsening issues or concerns, or if you decide you want COVID/Flu testing. Follow up with PCP regarding BP management as your BP was elevated today in Urgent Care, avoid smoking.

## 2020-12-30 NOTE — ED Triage Notes (Addendum)
Pt c/o nasal congestion, cough, left ear fullness, post nasal drip and sneezing. Started about a week ago. Denies fever. He states he had some tenderness in his left cervical lymph node area. Declines covid testing at this time.
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
## 2021-09-29 ENCOUNTER — Other Ambulatory Visit: Payer: Self-pay | Admitting: Obstetrics and Gynecology

## 2021-09-29 DIAGNOSIS — R1011 Right upper quadrant pain: Secondary | ICD-10-CM

## 2021-10-07 ENCOUNTER — Ambulatory Visit: Admission: RE | Admit: 2021-10-07 | Payer: BLUE CROSS/BLUE SHIELD | Source: Ambulatory Visit

## 2022-07-05 ENCOUNTER — Ambulatory Visit (INDEPENDENT_AMBULATORY_CARE_PROVIDER_SITE_OTHER): Payer: BLUE CROSS/BLUE SHIELD | Admitting: Family Medicine

## 2022-07-05 ENCOUNTER — Encounter: Payer: Self-pay | Admitting: Family Medicine

## 2022-07-05 VITALS — BP 129/80 | HR 91 | Temp 98.0°F | Resp 16 | Ht 71.0 in | Wt 194.0 lb

## 2022-07-05 DIAGNOSIS — K769 Liver disease, unspecified: Secondary | ICD-10-CM

## 2022-07-05 DIAGNOSIS — R739 Hyperglycemia, unspecified: Secondary | ICD-10-CM | POA: Diagnosis not present

## 2022-07-05 DIAGNOSIS — K219 Gastro-esophageal reflux disease without esophagitis: Secondary | ICD-10-CM

## 2022-07-05 DIAGNOSIS — E669 Obesity, unspecified: Secondary | ICD-10-CM

## 2022-07-05 DIAGNOSIS — E782 Mixed hyperlipidemia: Secondary | ICD-10-CM | POA: Diagnosis not present

## 2022-07-05 DIAGNOSIS — D171 Benign lipomatous neoplasm of skin and subcutaneous tissue of trunk: Secondary | ICD-10-CM | POA: Insufficient documentation

## 2022-07-05 DIAGNOSIS — I10 Essential (primary) hypertension: Secondary | ICD-10-CM | POA: Diagnosis not present

## 2022-07-05 NOTE — Assessment & Plan Note (Signed)
Reviewed ultrasound results It seems that these lesions are most consistent with benign hemangiomas, but he does need to get the MRI to rule out other possibilities Discussed this with the patient

## 2022-07-05 NOTE — Assessment & Plan Note (Signed)
Reviewed last lipid panel Not currently on a statin Recheck FLP and CMP Discussed diet and exercise  
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
## 2022-07-05 NOTE — Assessment & Plan Note (Signed)
Well controlled Continue current medications Recheck metabolic panel F/u in 6 months  

## 2022-07-05 NOTE — Assessment & Plan Note (Signed)
Chronic and stable Only with intermittent symptoms Taking probiotic helps Uses omeprazole as needed

## 2022-07-05 NOTE — Assessment & Plan Note (Signed)
Discussed importance of healthy weight management Discussed diet and exercise  

## 2022-07-05 NOTE — Assessment & Plan Note (Signed)
Discussed benign nature of lipomas If growing or painful, could consider surgical referral for evaluation

## 2022-07-06 LAB — COMPREHENSIVE METABOLIC PANEL
ALT: 24 IU/L (ref 0–44)
AST: 19 IU/L (ref 0–40)
Albumin/Globulin Ratio: 1.8 (ref 1.2–2.2)
Albumin: 4.6 g/dL (ref 4.1–5.1)
Alkaline Phosphatase: 91 IU/L (ref 44–121)
BUN/Creatinine Ratio: 16 (ref 9–20)
BUN: 19 mg/dL (ref 6–24)
Bilirubin Total: 0.5 mg/dL (ref 0.0–1.2)
CO2: 23 mmol/L (ref 20–29)
Calcium: 10.2 mg/dL (ref 8.7–10.2)
Chloride: 99 mmol/L (ref 96–106)
Creatinine, Ser: 1.16 mg/dL (ref 0.76–1.27)
Globulin, Total: 2.6 g/dL (ref 1.5–4.5)
Glucose: 103 mg/dL — ABNORMAL HIGH (ref 70–99)
Potassium: 4.4 mmol/L (ref 3.5–5.2)
Sodium: 138 mmol/L (ref 134–144)
Total Protein: 7.2 g/dL (ref 6.0–8.5)
eGFR: 79 mL/min/{1.73_m2} (ref >59)

## 2022-07-06 LAB — LIPID PANEL
Chol/HDL Ratio: 4.4 ratio (ref 0.0–5.0)
Cholesterol, Total: 226 mg/dL — ABNORMAL HIGH (ref 100–199)
HDL: 51 mg/dL (ref >39)
LDL Chol Calc (NIH): 157 mg/dL — ABNORMAL HIGH (ref 0–99)
Triglycerides: 101 mg/dL (ref 0–149)
VLDL Cholesterol Cal: 18 mg/dL (ref 5–40)

## 2022-07-06 LAB — HEMOGLOBIN A1C
Est. average glucose Bld gHb Est-mCnc: 108 mg/dL
Hgb A1c MFr Bld: 5.4 % (ref 4.8–5.6)

## 2022-08-03 ENCOUNTER — Inpatient Hospital Stay
Admission: RE | Admit: 2022-08-03 | Discharge: 2022-08-03 | Disposition: A | Discharge status: Home / Self Care | Payer: BLUE CROSS/BLUE SHIELD | Source: Ambulatory Visit | Attending: Family Medicine | Admitting: Family Medicine

## 2022-10-12 ENCOUNTER — Telehealth: Payer: Self-pay

## 2022-10-12 NOTE — Telephone Encounter (Signed)
Check to see if the patient had an appointment.

## 2023-03-08 ENCOUNTER — Ambulatory Visit (INDEPENDENT_AMBULATORY_CARE_PROVIDER_SITE_OTHER): Payer: BLUE CROSS/BLUE SHIELD | Admitting: Family Medicine

## 2023-03-08 DIAGNOSIS — Z91199 Patient's noncompliance with other medical treatment and regimen due to unspecified reason: Secondary | ICD-10-CM

## 2023-03-08 DIAGNOSIS — I10 Essential (primary) hypertension: Secondary | ICD-10-CM

## 2023-03-08 DIAGNOSIS — M25512 Pain in left shoulder: Secondary | ICD-10-CM

## 2023-03-08 DIAGNOSIS — Z09 Encounter for follow-up examination after completed treatment for conditions other than malignant neoplasm: Secondary | ICD-10-CM

## 2023-03-08 DIAGNOSIS — E782 Mixed hyperlipidemia: Secondary | ICD-10-CM

## 2023-03-08 DIAGNOSIS — R7989 Other specified abnormal findings of blood chemistry: Secondary | ICD-10-CM

## 2023-03-08 DIAGNOSIS — G8929 Other chronic pain: Secondary | ICD-10-CM
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
## 2023-03-29 ENCOUNTER — Ambulatory Visit: Payer: BLUE CROSS/BLUE SHIELD | Admitting: Family Medicine

## 2023-04-05 ENCOUNTER — Ambulatory Visit: Payer: BLUE CROSS/BLUE SHIELD | Admitting: Physical Therapy

## 2023-04-11 ENCOUNTER — Ambulatory Visit: Payer: BLUE CROSS/BLUE SHIELD | Admitting: Physical Therapy

## 2023-04-18 ENCOUNTER — Encounter: Payer: BLUE CROSS/BLUE SHIELD | Admitting: Physical Therapy

## 2023-04-25 ENCOUNTER — Encounter: Payer: BLUE CROSS/BLUE SHIELD | Admitting: Physical Therapy

## 2023-05-02 ENCOUNTER — Encounter: Payer: BLUE CROSS/BLUE SHIELD | Admitting: Physical Therapy

## 2023-05-09 ENCOUNTER — Encounter: Payer: BLUE CROSS/BLUE SHIELD | Admitting: Physical Therapy

## 2023-05-16 ENCOUNTER — Encounter: Payer: BLUE CROSS/BLUE SHIELD | Admitting: Physical Therapy

## 2023-05-23 ENCOUNTER — Encounter: Payer: BLUE CROSS/BLUE SHIELD | Admitting: Physical Therapy
